# Patient Record
Sex: Male | Born: 1980 | Hispanic: Yes | Marital: Married | Smoking: Former smoker
Health system: Southern US, Community
[De-identification: ages and names within clinical notes are randomized; demographics above are authoritative.]

---

## 2010-04-08 ENCOUNTER — Emergency Department: Payer: Self-pay | Admitting: Emergency Medicine

## 2012-01-31 IMAGING — CR DG LUMBAR SPINE 2-3V
1 series · 3 of 3 positions shown · non-contrast
Comparison: none

REASON FOR EXAM: fell off roof, tender t11-l1
COMMENTS:

[Series 1: view not recorded · 0.17mm/px · 3 of 3 slices shown]
[im 1/3]
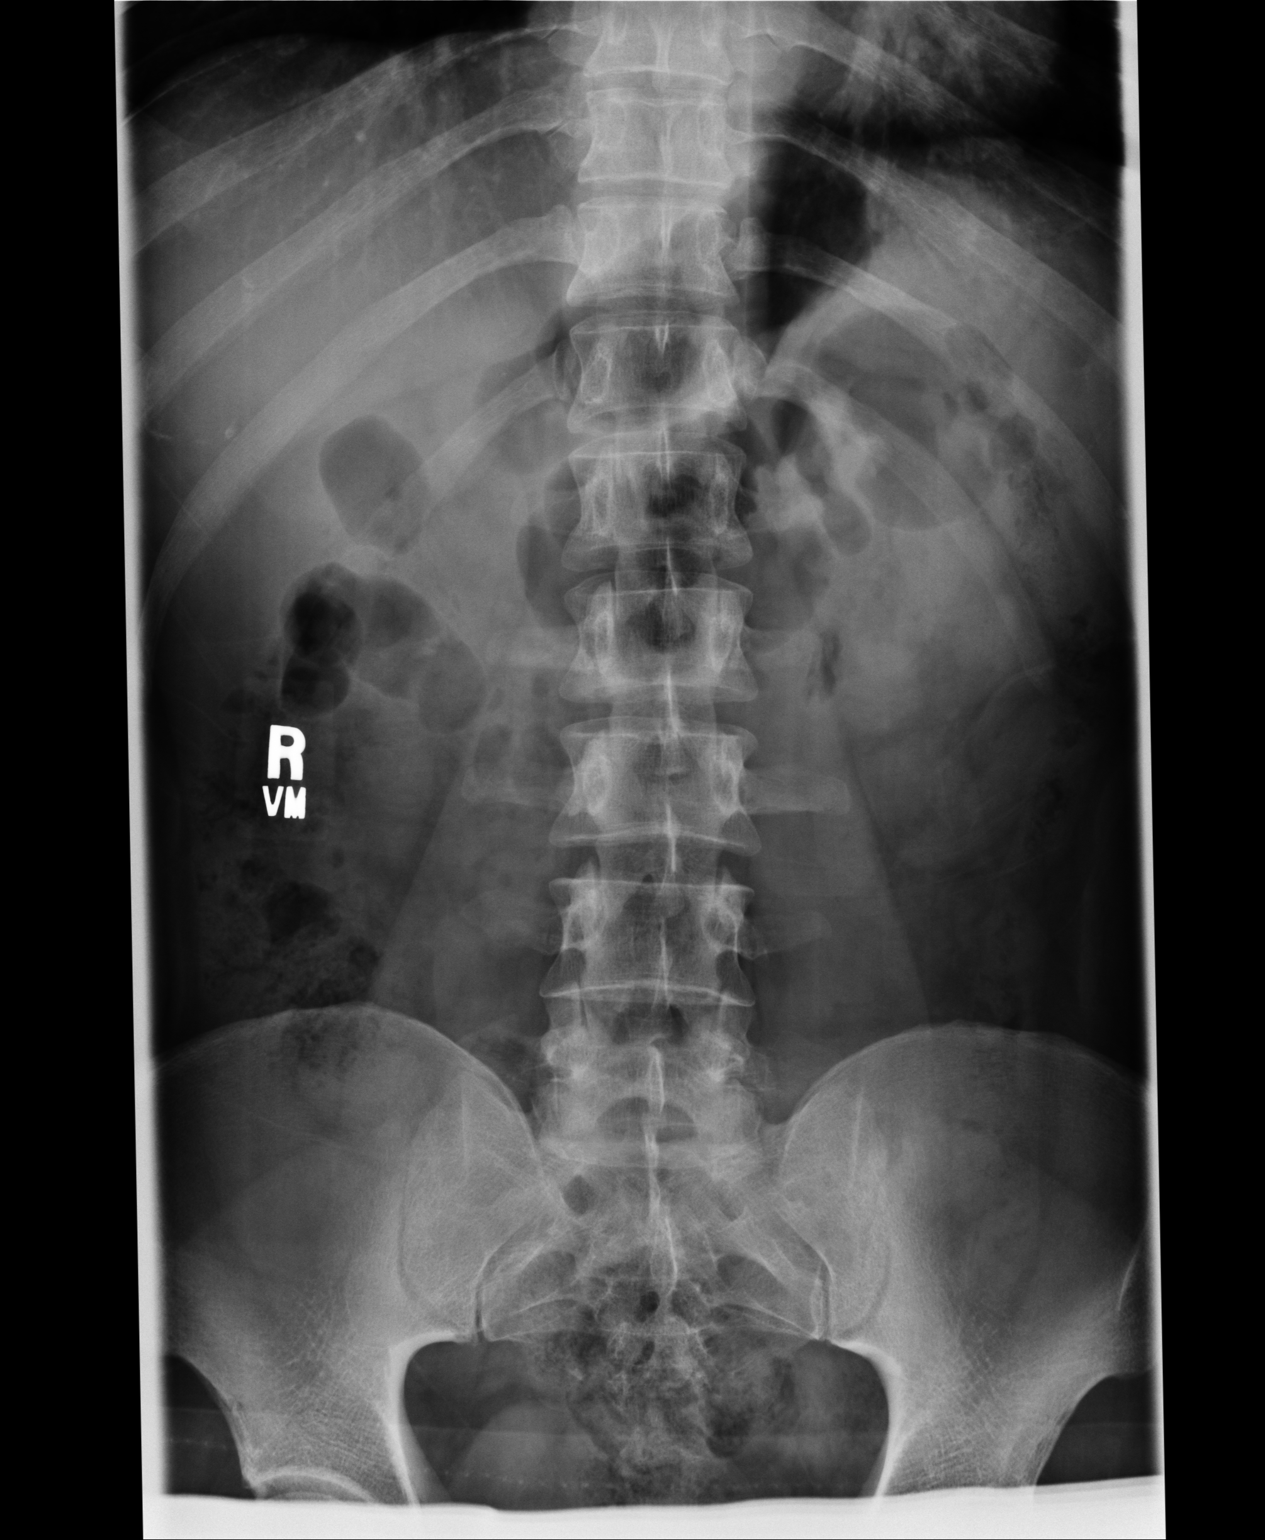
[im 2/3]
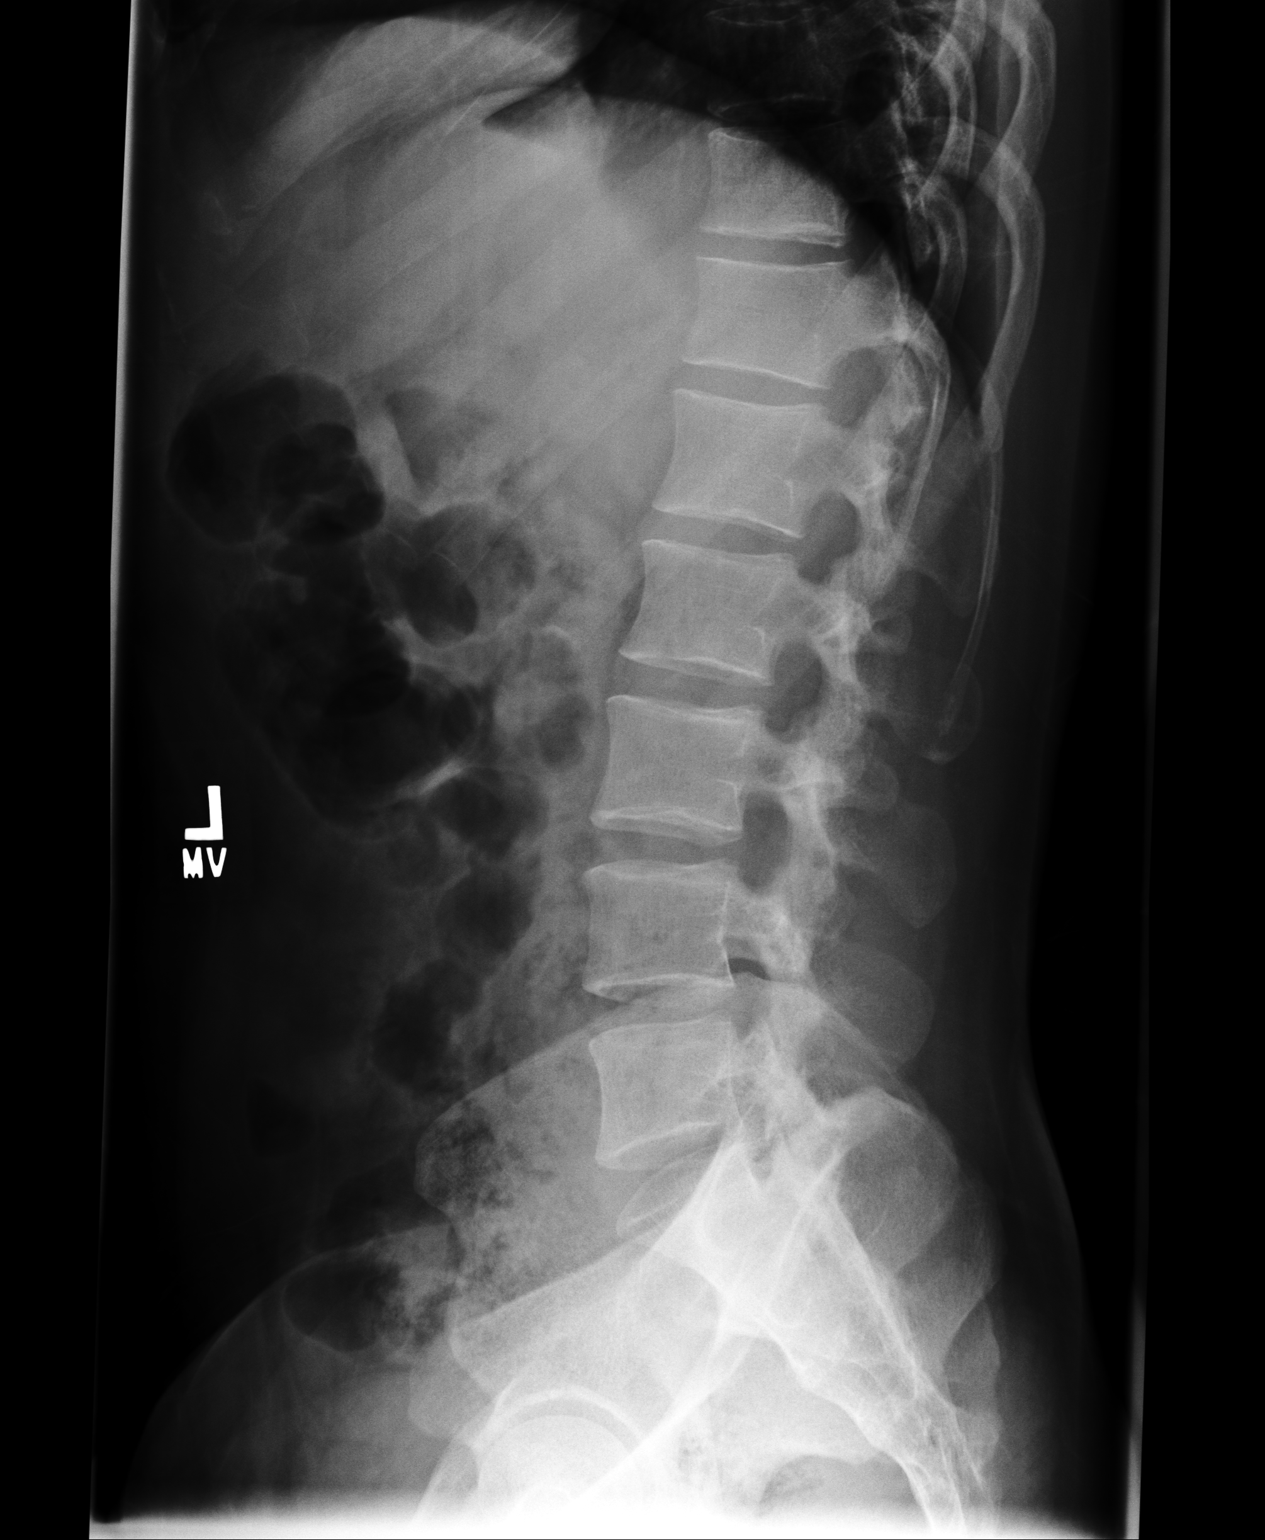
[im 3/3]
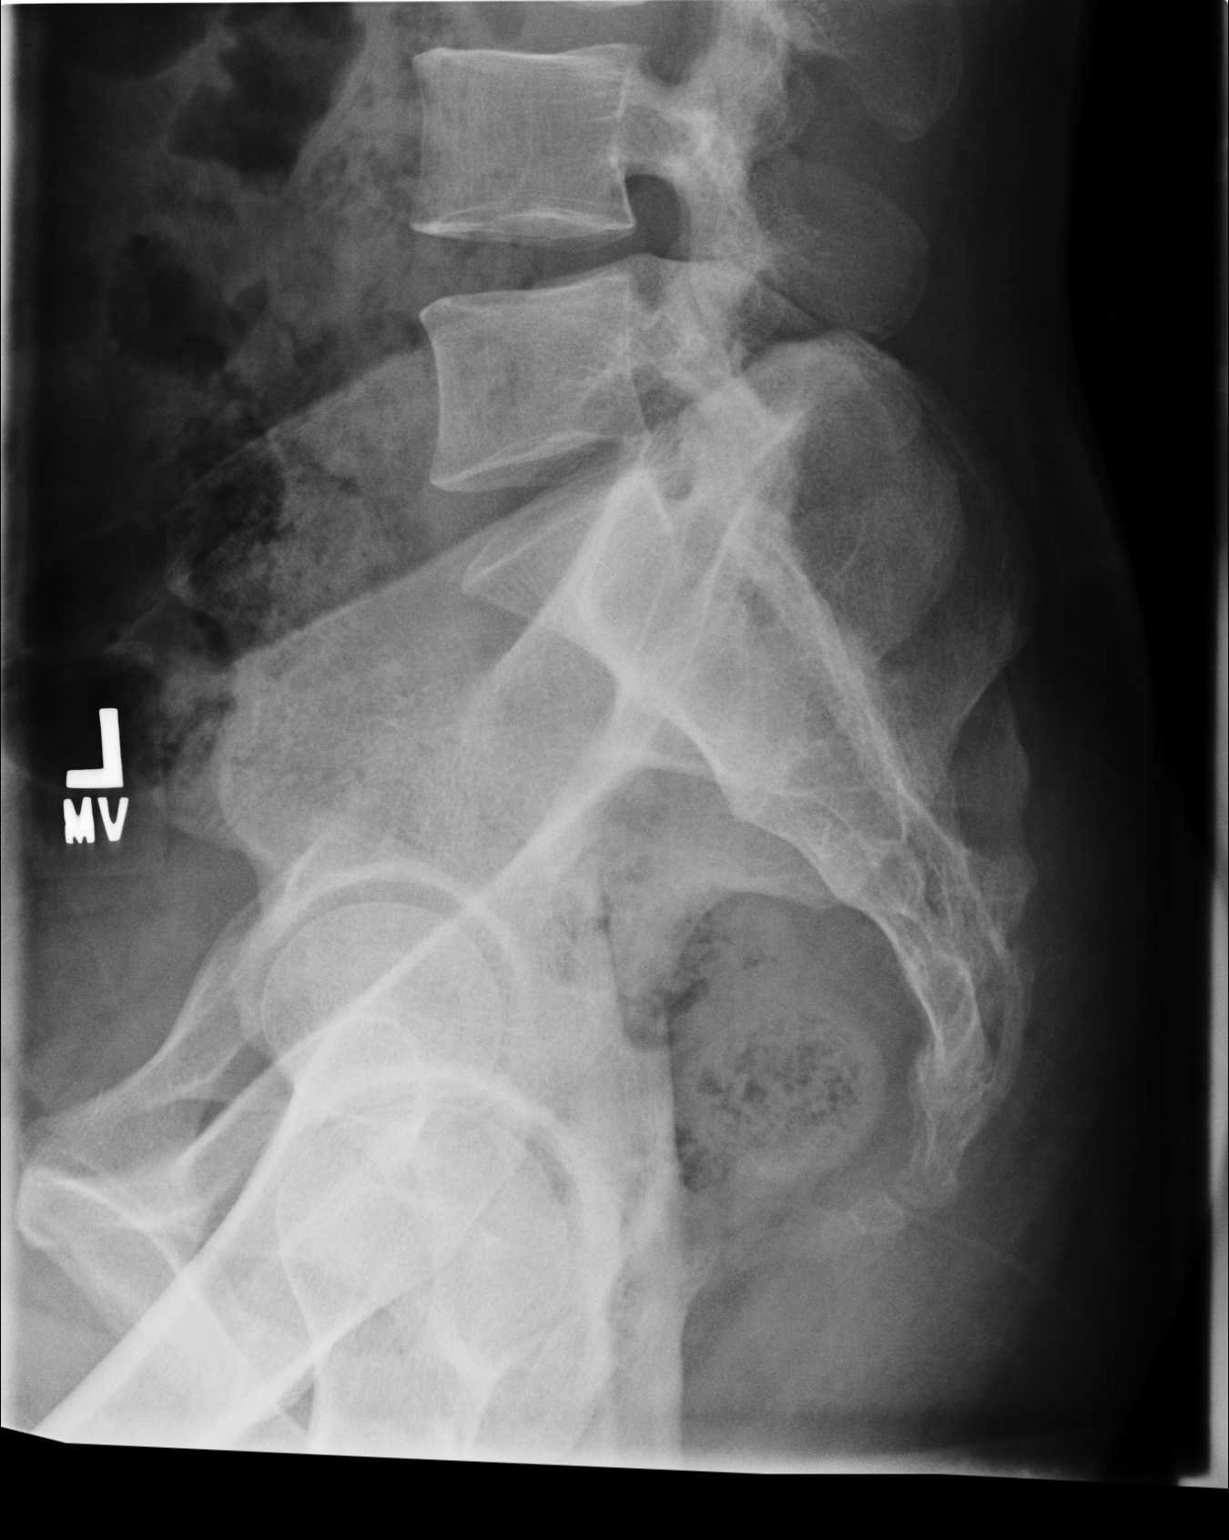

[3 of 3 positions shown; findings below may reference images not displayed]

PROCEDURE:     DXR - DXR LUMBAR SPINE AP AND LATERAL  - April 08, 2010 [DATE]

RESULT:     AP and lateral views of the lumbar spine were obtained. The
vertebral body heights and the intervertebral disc spaces are well
maintained. The vertebral body alignment is normal. The pedicles are
bilaterally intact.
IMPRESSION: 1.     No significant osseous abnormalities are noted.

## 2015-10-01 ENCOUNTER — Encounter: Payer: Self-pay | Admitting: Emergency Medicine

## 2015-10-01 ENCOUNTER — Emergency Department
Admission: EM | Admit: 2015-10-01 | Discharge: 2015-10-02 | Disposition: A | Discharge status: Home / Self Care | Payer: Self-pay | Attending: Emergency Medicine | Admitting: Emergency Medicine

## 2015-10-01 DIAGNOSIS — T6291XA Toxic effect of unspecified noxious substance eaten as food, accidental (unintentional), initial encounter: Secondary | ICD-10-CM | POA: Insufficient documentation

## 2015-10-01 DIAGNOSIS — D751 Secondary polycythemia: Secondary | ICD-10-CM | POA: Insufficient documentation

## 2015-10-01 DIAGNOSIS — Z87891 Personal history of nicotine dependence: Secondary | ICD-10-CM | POA: Insufficient documentation

## 2015-10-01 DIAGNOSIS — A059 Bacterial foodborne intoxication, unspecified: Secondary | ICD-10-CM

## 2015-10-01 LAB — CBC
HEMATOCRIT: 56.7 % — AB (ref 40.0–52.0)
Hemoglobin: 19.3 g/dL — ABNORMAL HIGH (ref 13.0–18.0)
MCH: 30.8 pg (ref 26.0–34.0)
MCHC: 34.1 g/dL (ref 32.0–36.0)
MCV: 90.4 fL (ref 80.0–100.0)
PLATELETS: 201 10*3/uL (ref 150–440)
RBC: 6.27 MIL/uL — AB (ref 4.40–5.90)
RDW: 13.5 % (ref 11.5–14.5)
WBC: 11.6 10*3/uL — AB (ref 3.8–10.6)

## 2015-10-01 LAB — COMPREHENSIVE METABOLIC PANEL
ALT: 44 U/L (ref 17–63)
AST: 36 U/L (ref 15–41)
Albumin: 6 g/dL — ABNORMAL HIGH (ref 3.5–5.0)
Alkaline Phosphatase: 78 U/L (ref 38–126)
Anion gap: 15 (ref 5–15)
BUN: 20 mg/dL (ref 6–20)
CHLORIDE: 102 mmol/L (ref 101–111)
CO2: 22 mmol/L (ref 22–32)
CREATININE: 1.14 mg/dL (ref 0.61–1.24)
Calcium: 10.3 mg/dL (ref 8.9–10.3)
GFR calc Af Amer: >60 mL/min (ref >60)
GFR calc non Af Amer: >60 mL/min (ref >60)
Glucose, Bld: 118 mg/dL — ABNORMAL HIGH (ref 65–99)
POTASSIUM: 3.9 mmol/L (ref 3.5–5.1)
SODIUM: 139 mmol/L (ref 135–145)
Total Bilirubin: 0.8 mg/dL (ref 0.3–1.2)
Total Protein: 9.4 g/dL — ABNORMAL HIGH (ref 6.5–8.1)

## 2015-10-01 LAB — LIPASE, BLOOD: LIPASE: 24 U/L (ref 11–51)

## 2015-10-01 MED ORDER — SODIUM CHLORIDE 0.9 % IV BOLUS (SEPSIS)
1000.0000 mL | Freq: Once | INTRAVENOUS | Status: AC
  Administered 2015-10-01: 1000 mL via INTRAVENOUS

## 2015-10-01 MED ORDER — ONDANSETRON HCL 4 MG PO TABS: 4.0000 mg | ORAL_TABLET | Freq: Three times a day (TID) | ORAL | Status: DC | PRN

## 2015-10-01 MED ORDER — ACETAMINOPHEN 325 MG PO TABS
650.0000 mg | ORAL_TABLET | Freq: Once | ORAL | Status: AC
  Administered 2015-10-02: 650 mg via ORAL
  Filled 2015-10-01: qty 2

## 2015-10-01 MED ORDER — ONDANSETRON HCL 4 MG/2ML IJ SOLN
4.0000 mg | Freq: Once | INTRAMUSCULAR | Status: AC | PRN
  Administered 2015-10-01: 4 mg via INTRAVENOUS
  Filled 2015-10-01: qty 2

## 2015-10-01 NOTE — ED Notes (Signed)
Used Spanish Tele-interpreter: Malissa Hippo(Vil [id number]).   Patient reports abdominal pain, vomiting and diarrhea after eating at noon.  Patient reports eating chicken for lunch, patient states the person who also ate the same chicken for lunch is also sick.  Patient is complaining of diffuse stomach pain.  Patient denies similar symptoms in the past.  Patient reports constant diarrhea and vomiting since eating.  Patient states diarrhea appears green.

## 2015-10-01 NOTE — Discharge Instructions (Signed)
Please call Dr. Sharlette DenseBrahmanday's office to set up an appointment. Please seek medical attention for any high fevers, chest pain, shortness of breath, change in behavior, persistent vomiting, bloody stool or any other new or concerning symptoms.   Intoxicacin por alimentos (Food Poisoning)  La intoxicacin alimentaria es una enfermedad causada por algo que ha comido o bebido. Puede durar entre 1 y [address] das. Los Musicianproblemas pueden empeorar en las personas que tienen el sistema inmunolgico debilitado, Teachers Insurance and Annuity Associationen los ancianos, los nios y los bebs, y en las mujeres Frankfortembarazadas.  CUIDADOS EN EL HOGAR  Beba gran cantidad de lquido para mantener la orina de tono claro o color amarillo plido. Beba pequeas cantidades de lquidos con frecuencia.  Consulte a su mdico como reponer la prdida de lquidos (rehidratacin).  Evite:  Los alimentos que Nurse, adulttengan mucha azcar.  El alcohol  Las bebidas gaseosas (carbonatadas).  El tabaco  Jugos  Bebidas con cafena  Lquidos muy calientes o fros  Alimentos muy grasos  Comer mucha cantidad por vez.  Productos lcteos hasta 24 a 48 horas despus que la materia fecal ya no sea lquida (diarrea).  Puede consumir alimentos que tengan cultivos activos (probiticos). Estos cultivos puede encontrarlos en algunos tipos de yogur y suplementos.  Lave bien sus manos para evitar el contagio de la enfermedad.  Tome la medicacin slo como le haya indicado el mdico. No administre aspirina a los nios.  Consulte al mdico si puede seguir Affiliated Computer Servicestomando los medicamentos que [place] habitualmente. SOLICITE AYUDA DE INMEDIATO SI:   Tiene problemas para respirar, tragar, hablar o moverse.  Tiene la visin borrosa.  No puede retener los lquidos.  Se siente dbil, sufre mareos o se desmaya.  Los ojos Bank of New York Companyestn amarillos.  Contina vomitando o con diarrea.  Comienza a Psychiatristsentir dolor en el vientre (abdominal), el dolor empeora o se sita en un pequeo punto ( se  localiza).  Tiene fiebre.  Observa sangre con la diarrea.  Se siente muy dbil, mareado o tiene mucha sed.  No ha orinado por 8 horas. ASEGRESE QUE:   Comprende estas instrucciones.  Controlar su enfermedad.  Solicitar ayuda de inmediato si no mejora o empeora.   Esta informacin no tiene Theme park managercomo fin reemplazar el consejo del mdico. Asegrese de hacerle al mdico cualquier pregunta que tenga.   Document Released: 06/18/2010 Document Revised: 08/08/2011 Elsevier Interactive Patient Education [employer]2016 Elsevier Inc.

## 2015-10-01 NOTE — ED Notes (Signed)
Pt informed urine sample is needed, and provided a urinal

## 2015-10-01 NOTE — ED Provider Notes (Signed)
[facility] Emergency Department Provider Note  ____________________________________________  Time seen: ~2130  I have reviewed the triage vital signs and the nursing notes.   HISTORY  Chief Complaint Abdominal pain, vomiting  History limited by: Language [facility] Interpreter utilized   HPI Cesar Pace is a 35 y.o. male who presents to the emergency department today with concerns for abdominal pain, nausea and vomiting. The patient states that the pain started roughly 2 hours after he ate chicken. It started suddenly. He describes his pain as being diffuse. It was severe. He states that someone else who also ate the chicken was also sick although had a milder case. Did have multiple episodes of nonbloody vomiting. Denies any fevers. Denies similar symptoms in the past.   History reviewed. No pertinent past medical history.  There are no active problems to display for this patient.   History reviewed. No pertinent past surgical history.  No current outpatient prescriptions on file.  Allergies Review of patient's allergies indicates no known allergies.  No family history on file.  Social History Social History  Substance Use Topics  . Smoking status: Never Smoker   . Smokeless tobacco: None  . Alcohol Use: No    Review of Systems  Constitutional: Negative for fever. Cardiovascular: Negative for chest pain. Respiratory: Negative for shortness of breath. Gastrointestinal: Positive for abdominal pain, nausea and vomiting Neurological: Negative for headaches, focal weakness or numbness.  10-point ROS otherwise negative.  ____________________________________________   PHYSICAL EXAM:  VITAL SIGNS: ED Triage Vitals  Enc Vitals Group     BP 10/01/15 1900 110/69 mmHg     Pulse Rate 10/01/15 1900 87     Resp 10/01/15 1900 18     Temp 10/01/15 1900 97.8 F (36.6 C)     Temp Source 10/01/15 1900 Oral     SpO2 10/01/15  1900 100 %     Weight 10/01/15 1900 154 lb 5.2 oz (70 kg)     Height 10/01/15 1900  (1.626 m)     Head Cir --      Peak Flow --      Pain Score 10/01/15 1901 10   Constitutional: Alert and oriented. Well appearing and in no distress. Eyes: Conjunctivae are normal. PERRL. Normal extraocular movements. ENT   Head: Normocephalic and atraumatic.   Nose: No congestion/rhinnorhea.   Mouth/Throat: Mucous membranes are moist.   Neck: No stridor. Hematological/Lymphatic/Immunilogical: No cervical lymphadenopathy. Cardiovascular: Tachycardic, regular rhythm.  No murmurs, rubs, or gallops. Respiratory: Normal respiratory effort without tachypnea nor retractions. Breath sounds are clear and equal bilaterally. No wheezes/rales/rhonchi. Gastrointestinal: Soft and nontender. No distention.  Genitourinary: Deferred Musculoskeletal: Normal range of motion in all extremities. No joint effusions.  No lower extremity tenderness nor edema. Neurologic:  Normal speech and language. No gross focal neurologic deficits are appreciated.  Skin:  Skin is warm, dry and intact. No rash noted. Psychiatric: Mood and affect are normal. Speech and behavior are normal. Patient exhibits appropriate insight and judgment.  ____________________________________________    LABS (pertinent positives/negatives)  Labs Reviewed  COMPREHENSIVE METABOLIC PANEL - Abnormal; Notable for the following:    Glucose, Bld 118 (*)    Total Protein 9.4 (*)    Albumin 6.0 (*)    All other components within normal limits  CBC - Abnormal; Notable for the following:    WBC 11.6 (*)    RBC 6.27 (*)    Hemoglobin 19.3 (*)    HCT 56.7 (*)  All other components within normal limits  LIPASE, BLOOD  URINALYSIS COMPLETEWITH MICROSCOPIC ([facility] ONLY)     ____________________________________________   EKG  None  ____________________________________________     RADIOLOGY  None  ____________________________________________   PROCEDURES  Procedure(s) performed: None  Critical Care performed: No  ____________________________________________   INITIAL IMPRESSION / ASSESSMENT AND PLAN / ED COURSE  Pertinent labs & imaging results that were available during my care of the patient were reviewed by me and considered in my medical decision making (see chart for details).  Patient presents to the emergency department with abdominal pain, nausea and vomiting. I think this is likely related to food poisoning given that his companion also got sick. On my exam abdomen is nontender. The patient's heart rate did improve after fluids. Patient did not have any further vomiting after Zofran. On blood work however he does show quite elevated red blood cells and hemoglobin. He has no known history of such. Will give him hematology follow-up.  ____________________________________________   FINAL CLINICAL IMPRESSION(S) / ED DIAGNOSES  Final diagnoses:  Food poisoning     Phineas SemenGraydon Shetara Launer, MD 10/01/15 2322

## 2015-10-01 NOTE — ED Notes (Signed)
Informed MD Derrill KayGoodman of pt's temperature

## 2015-10-01 NOTE — ED Notes (Signed)
MD Derrill KayGoodman at bedside with translator.

## 2015-10-01 NOTE — ED Notes (Signed)
Used spanish interpreter to assess patient

## 2015-10-01 NOTE — ED Notes (Signed)
PT reports he has tried to taken peptobismal twice today, and vomited both times. He has been unable to tolerate any food or fluids. Pt denies current nausea.

## 2015-10-01 NOTE — ED Notes (Signed)
Pt reports he at lunch at approx 12pm (chicken), and 2 hours later began to have N/V/D, chills and abdominal pain. Pt reports he has been unable to tolerate any food/fluids since onset of symptoms. Pt reports his lunch mate also currently having the same symptoms.

## 2015-10-01 NOTE — ED Notes (Signed)
Developed generalized stomach about mid day today with n/v

## 2015-10-02 LAB — URINALYSIS COMPLETE WITH MICROSCOPIC (ARMC ONLY)
BILIRUBIN URINE: NEGATIVE
Glucose, UA: NEGATIVE mg/dL
HGB URINE DIPSTICK: NEGATIVE
Leukocytes, UA: NEGATIVE
Nitrite: NEGATIVE
PH: 5 (ref 5.0–8.0)
PROTEIN: 30 mg/dL — AB
Specific Gravity, Urine: 1.027 (ref 1.005–1.030)

## 2015-10-02 NOTE — ED Notes (Signed)
  Reviewed d/c instructions, follow-up care, and prescriptions with pt. Pt verbalized understanding 

## 2015-10-05 ENCOUNTER — Inpatient Hospital Stay: Payer: Self-pay

## 2015-10-05 ENCOUNTER — Encounter: Payer: Self-pay | Admitting: Hematology and Oncology

## 2015-10-05 ENCOUNTER — Inpatient Hospital Stay: Payer: Self-pay | Attending: Hematology and Oncology | Admitting: Hematology and Oncology

## 2015-10-05 VITALS — BP 111/68 | HR 68 | Temp 97.7°F | Resp 18 | Ht 64.0 in | Wt 165.1 lb

## 2015-10-05 DIAGNOSIS — D751 Secondary polycythemia: Secondary | ICD-10-CM | POA: Insufficient documentation

## 2015-10-05 DIAGNOSIS — R718 Other abnormality of red blood cells: Secondary | ICD-10-CM | POA: Insufficient documentation

## 2015-10-05 DIAGNOSIS — Z87891 Personal history of nicotine dependence: Secondary | ICD-10-CM | POA: Insufficient documentation

## 2015-10-05 DIAGNOSIS — R779 Abnormality of plasma protein, unspecified: Secondary | ICD-10-CM

## 2015-10-05 DIAGNOSIS — E8809 Other disorders of plasma-protein metabolism, not elsewhere classified: Secondary | ICD-10-CM

## 2015-10-05 LAB — CBC WITH DIFFERENTIAL/PLATELET
Basophils Absolute: 0 10*3/uL (ref 0–0.1)
Basophils Relative: 0 %
Eosinophils Absolute: 0.1 10*3/uL (ref 0–0.7)
Eosinophils Relative: 2 %
HCT: 49.4 % (ref 40.0–52.0)
Hemoglobin: 17.4 g/dL (ref 13.0–18.0)
Lymphocytes Relative: 39 %
Lymphs Abs: 2 10*3/uL (ref 1.0–3.6)
MCH: 31.2 pg (ref 26.0–34.0)
MCHC: 35.1 g/dL (ref 32.0–36.0)
MCV: 88.8 fL (ref 80.0–100.0)
Monocytes Absolute: 0.4 10*3/uL (ref 0.2–1.0)
Monocytes Relative: 8 %
Neutro Abs: 2.7 10*3/uL (ref 1.4–6.5)
Neutrophils Relative %: 51 %
Platelets: 203 10*3/uL (ref 150–440)
RBC: 5.56 MIL/uL (ref 4.40–5.90)
RDW: 13.2 % (ref 11.5–14.5)
WBC: 5.2 10*3/uL (ref 3.8–10.6)

## 2015-10-05 LAB — FERRITIN: Ferritin: 127 ng/mL (ref 24–336)

## 2015-10-05 LAB — IRON AND TIBC
Iron: 68 ug/dL (ref 45–182)
Saturation Ratios: 23 % (ref 17.9–39.5)
TIBC: 296 ug/dL (ref 250–450)
UIBC: 228 ug/dL

## 2015-10-05 NOTE — Progress Notes (Signed)
Since ER visit pt reports these days he has felt tired

## 2015-10-05 NOTE — Progress Notes (Signed)
[name]:  10/05/2015  Chief Complaint: Cesar Pace is a 35 y.o. 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 Nance Pear for assessment and management.  HPI:  The patient denies any past medical history.  He does not have a primary care physician.  He gets his health care through the emergency room.  Prior to recent events, he has never had his blood drawn.  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  He describes eating chicken with broth cooked on the job site in a microwave.  Approximately 1 hour after eating, he began to vomit.  He presented to the ER on 10/01/2015 with abdominal pain, nausea, and vomiting.  Another co-worker became ill, but had a "milder case".  He states that he was drinking fluids well on the job site.  He denied dehydration.  ER evaluation revealed a hematocrit of 56.7, hemoglobin 19.3, MCV 90.4, platelets 201,000, and WBC 11,600.  BUN was 20 with a creatinine of 1.14.  Calcium was 10.3.  Albumen was 6.0.  Protein was 9.4.  Urinalysis revealed a specific gravity of 1.027.  He received IV fluids.  The patient denies any tobacco use.  He denies any cardiac or pulmonary problems.  He denies sleep apnea.  He denies any medications or herbal products.  He does not take testosterone.  He denies any family history of any blood disorder.  Symptomatically, he feels good.  He denies any complaints.  He describes 2 episodes of nausea approximately 6 months ago.  He denies any headache or visual changes.  He denies any numbness or tingling.  He denies any fevers, sweats or weight loss.  No past medical history on file.  No past surgical history on file.  Family History  Problem Relation Age of Onset  . Hypertension Mother     Social History:  reports that he quit smoking about 10 years ago. His smoking use included Cigarettes. He smoked 0.50 packs per day. He has never used smokeless tobacco. He reports that he does  not drink alcohol or use illicit drugs.  The patient is from [place].  He has been in the [place] states for 12 years.  He lived in [place] for 3 years.  He has lived in [place] for 9 years.  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  Allergies: No Known Allergies  Current Medications: Current Outpatient Prescriptions  Medication Sig Dispense Refill  . ondansetron (ZOFRAN) 4 MG tablet Take 1 tablet (4 mg total) by mouth every 8 (eight) hours as needed for nausea or vomiting. 20 tablet 0   No current facility-administered medications for this visit.    Review of Systems:  GENERAL:  Feels good.  Active.  No fevers, sweats or weight loss. PERFORMANCE STATUS (ECOG):  0 HEENT:  No visual changes, runny nose, sore throat, mouth sores or tenderness. Lungs: No shortness of breath or cough.  No hemoptysis. Cardiac:  No chest pain, palpitations, orthopnea, or PND. GI:  No nausea, vomiting, diarrhea, constipation, melena or hematochezia. GU:  No urgency, frequency, dysuria, or hematuria. Musculoskeletal:  No back pain.  No joint pain.  No muscle tenderness. Extremities:  No pain or swelling. Skin:  No rashes or skin changes. Neuro:  No headache, numbness or weakness, balance or coordination issues. Endocrine:  No diabetes, thyroid issues, hot flashes or night sweats. Psych:  No mood changes, depression or anxiety. Pain:  No focal pain. Review of  systems:  All other systems reviewed and found to be negative.  Physical Exam: Blood pressure 111/68, pulse 68, temperature 97.7 F (36.5 C), temperature source Tympanic, resp. rate 18, height 5' 4"  (1.626 m), weight 165 lb 2 oz (74.9 kg). GENERAL:  Well developed, well nourished, gentleman sitting comfortably in the exam room in no acute distress. MENTAL STATUS:  Alert and oriented to person, place and time. HEAD:  Short black hair.  Mustache.  Normocephalic, atraumatic, face symmetric, no Cushingoid features. EYES:   Brown eyes.  Pupils equal round and reactive to light and accomodation.  No conjunctivitis or scleral icterus. ENT:  Oropharynx clear without lesion.  Tongue normal. Mucous membranes moist.  RESPIRATORY:  Clear to auscultation without rales, wheezes or rhonchi. CARDIOVASCULAR:  Regular rate and rhythm without murmur, rub or gallop. ABDOMEN:  Soft, non-tender, with active bowel sounds, and no hepatosplenomegaly.  No masses. SKIN:  No rashes, ulcers or lesions. EXTREMITIES: No edema, no skin discoloration or tenderness.  No palpable cords. LYMPH NODES: No palpable cervical, supraclavicular, axillary or inguinal adenopathy  NEUROLOGICAL: Unremarkable. PSYCH:  Appropriate.  No visits with results within 3 Day(s) from this visit. Latest known visit with results is:  Admission on 10/01/2015, Discharged on 10/02/2015  Component Date Value Ref Range Status  . Lipase 10/01/2015 24  11 - 51 U/L Final  . Sodium 10/01/2015 139  135 - 145 mmol/L Final  . Potassium 10/01/2015 3.9  3.5 - 5.1 mmol/L Final  . Chloride 10/01/2015 102  101 - 111 mmol/L Final  . CO2 10/01/2015 22  22 - 32 mmol/L Final  . Glucose, Bld 10/01/2015 118* 65 - 99 mg/dL Final  . BUN 10/01/2015 20  6 - 20 mg/dL Final  . Creatinine, Ser 10/01/2015 1.14  0.61 - 1.24 mg/dL Final  . Calcium 10/01/2015 10.3  8.9 - 10.3 mg/dL Final  . Total Protein 10/01/2015 9.4* 6.5 - 8.1 g/dL Final  . Albumin 10/01/2015 6.0* 3.5 - 5.0 g/dL Final  . AST 10/01/2015 36  15 - 41 U/L Final  . ALT 10/01/2015 44  17 - 63 U/L Final  . Alkaline Phosphatase 10/01/2015 78  38 - 126 U/L Final  . Total Bilirubin 10/01/2015 0.8  0.3 - 1.2 mg/dL Final  . GFR calc non Af Amer 10/01/2015 >60  >60 mL/min Final  . GFR calc Af Amer 10/01/2015 >60  >60 mL/min Final   Comment: (NOTE) The eGFR has been calculated using the CKD EPI equation. This calculation has not been validated in all clinical situations. eGFR's persistently <60 mL/min signify possible Chronic  Kidney Disease.   . Anion gap 10/01/2015 15  5 - 15 Final  . WBC 10/01/2015 11.6* 3.8 - 10.6 K/uL Final  . RBC 10/01/2015 6.27* 4.40 - 5.90 MIL/uL Final  . Hemoglobin 10/01/2015 19.3* 13.0 - 18.0 g/dL Final  . HCT 10/01/2015 56.7* 40.0 - 52.0 % Final  . MCV 10/01/2015 90.4  80.0 - 100.0 fL Final  . MCH 10/01/2015 30.8  26.0 - 34.0 pg Final  . MCHC 10/01/2015 34.1  32.0 - 36.0 g/dL Final  . RDW 10/01/2015 13.5  11.5 - 14.5 % Final  . Platelets 10/01/2015 201  150 - 440 K/uL Final  . Color, Urine 10/02/2015 YELLOW* YELLOW Final  . APPearance 10/02/2015 CLEAR* CLEAR Final  . Glucose, UA 10/02/2015 NEGATIVE  NEGATIVE mg/dL Final  . Bilirubin Urine 10/02/2015 NEGATIVE  NEGATIVE Final  . Ketones, ur 10/02/2015 TRACE* NEGATIVE mg/dL Final  . Specific Gravity,  Urine 10/02/2015 1.027  1.005 - 1.030 Final  . Hgb urine dipstick 10/02/2015 NEGATIVE  NEGATIVE Final  . pH 10/02/2015 5.0  5.0 - 8.0 Final  . Protein, ur 10/02/2015 30* NEGATIVE mg/dL Final  . Nitrite 10/02/2015 NEGATIVE  NEGATIVE Final  . Leukocytes, UA 10/02/2015 NEGATIVE  NEGATIVE Final  . RBC / HPF 10/02/2015 0-5  0 - 5 RBC/hpf Final  . WBC, UA 10/02/2015 0-5  0 - 5 WBC/hpf Final  . Bacteria, UA 10/02/2015 RARE* NONE SEEN Final  . Squamous Epithelial / LPF 10/02/2015 0-5* NONE SEEN Final  . Mucous 10/02/2015 PRESENT   Final  . Hyaline Casts, UA 10/02/2015 PRESENT   Final    Assessment:  Cesar Pace is a 35 y.o. male with no past medical history presented with erythrocytosis on 10/01/2015 after working outside and experiencing several episodes of nausea and vomiting.  Labs suggest possible dehydration.  He has no prior labs.  He denies any tobacco use.  He denies any cardiac or pulmonary problems.  He denies sleep apnea.  He does not take testosterone.  He denies any family history of any blood disorder.  Labs on 10/01/2015 revealed a hematocrit of 56.7, hemoglobin 19.3, MCV 90.4, platelets 201,000, and WBC 11,600.   BUN was 20 with a creatinine of 1.14.  Calcium was 10.3, albumen 6.0, and protein 9.4.    Symptomatically, he feels well.  Exam is unremarkable.  Plan: 1.  Review diagnosis of erythrocytosis.  Discuss secondary versus primary polycythemia.  Discuss laboratory work-up.  Discuss potential phlebotomy today. 2.  Labs today:  CBC with diff, ferritin, iron studies, carboxyhemoglobin level, epo level, JAK2 with reflex to exon 12, SPEP with immunoglobulins. 3.  RTC on 10/16/2015 for review of work-up and discussion regarding direction of therapy.    Lequita Asal, MD  10/05/2015, 2:58 PM

## 2015-10-06 LAB — PROTEIN ELECTROPHORESIS, SERUM
A/G Ratio: 1.4 (ref 0.7–1.7)
Albumin ELP: 4.2 g/dL (ref 2.9–4.4)
Alpha-1-Globulin: 0.1 g/dL (ref 0.0–0.4)
Alpha-2-Globulin: 0.7 g/dL (ref 0.4–1.0)
Beta Globulin: 1 g/dL (ref 0.7–1.3)
Gamma Globulin: 1.1 g/dL (ref 0.4–1.8)
Globulin, Total: 3 g/dL (ref 2.2–3.9)
Total Protein ELP: 7.2 g/dL (ref 6.0–8.5)

## 2015-10-06 LAB — CARBON MONOXIDE, BLOOD (PERFORMED AT REF LAB): Carbon Monoxide, Blood: 2.3 % (ref 0.0–3.6)

## 2015-10-16 ENCOUNTER — Inpatient Hospital Stay (HOSPITAL_BASED_OUTPATIENT_CLINIC_OR_DEPARTMENT_OTHER): Payer: Self-pay | Admitting: Hematology and Oncology

## 2015-10-16 ENCOUNTER — Inpatient Hospital Stay: Payer: Self-pay

## 2015-10-16 VITALS — BP 116/70 | HR 78 | Temp 97.5°F | Resp 18 | Wt 163.9 lb

## 2015-10-16 DIAGNOSIS — R718 Other abnormality of red blood cells: Secondary | ICD-10-CM

## 2015-10-16 DIAGNOSIS — D751 Secondary polycythemia: Secondary | ICD-10-CM

## 2015-10-16 DIAGNOSIS — Z87891 Personal history of nicotine dependence: Secondary | ICD-10-CM

## 2015-10-16 LAB — CBC WITH DIFFERENTIAL/PLATELET
Basophils Absolute: 0.1 10*3/uL (ref 0–0.1)
Basophils Relative: 1 %
Eosinophils Absolute: 0.3 10*3/uL (ref 0–0.7)
Eosinophils Relative: 3 %
HCT: 45 % (ref 40.0–52.0)
Hemoglobin: 16 g/dL (ref 13.0–18.0)
Lymphocytes Relative: 18 %
Lymphs Abs: 1.7 10*3/uL (ref 1.0–3.6)
MCH: 31.5 pg (ref 26.0–34.0)
MCHC: 35.5 g/dL (ref 32.0–36.0)
MCV: 88.7 fL (ref 80.0–100.0)
Monocytes Absolute: 0.6 10*3/uL (ref 0.2–1.0)
Monocytes Relative: 6 %
Neutro Abs: 6.9 10*3/uL — ABNORMAL HIGH (ref 1.4–6.5)
Neutrophils Relative %: 72 %
Platelets: 161 10*3/uL (ref 150–440)
RBC: 5.08 MIL/uL (ref 4.40–5.90)
RDW: 13 % (ref 11.5–14.5)
WBC: 9.5 10*3/uL (ref 3.8–10.6)

## 2015-10-16 NOTE — Progress Notes (Signed)
Pt has a cold x 3 days, no other complaints at this time.

## 2015-10-17 ENCOUNTER — Encounter: Payer: Self-pay | Admitting: Hematology and Oncology

## 2015-10-17 NOTE — Progress Notes (Signed)
[facility]-  [facility] Center  Clinic day:  10/16/2015  Chief Complaint: Cesar Pace is a 35 y.o. 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  HPI:  The patient was last seen in the medical oncology clinic on 10/05/2015 for initial consultation.  He had presented to the emergency room with nausea and vomiting.  Labs revealed a hematocrit of 56.7 and hemoglobin 19.3.  Protein was 9.4.  He denied any tobacco use, cardiac or pulmonary problems, sleep apnea, testosterone use or any family history of a blood disorder.  Symptomatically, he felt good.  Exam was unremarkable.  Labs on 10/05/2015 revealed a  hematocrit of 49.4, hemoglobin 17.4, MCV 88.8, platelets 203,000, white count 5200 with an ANC of 2700. Differential was unremarkable.  Carbon monoxide level was 2.3 (normal). Ferritin was 127 with an iron saturation of 23% a TIBC of 296 (normal). Serum protein electrophoresis.  Revealed no monoclonal protein.  Pending are erythropoietin level, JAK2 and immunoglobulin levels.  During the interim, he has done well.  He denies any complaint.  He notes a mild URI.   No past medical history on file.  No past surgical history on file.  Family History  Problem Relation Age of Onset  . Hypertension Mother     Social History:  reports that he quit smoking about 10 years ago. His smoking use included Cigarettes. He smoked 0.50 packs per day. He has never used smokeless tobacco. He reports that he does not drink alcohol or use illicit drugs.  The patient is from [place].  He has been in the [place] states for 12 years.  He lived in [place] for 3 years.  He has lived in [place] for 9 years.  He works Holiday representative. The patient is accompanied by the interpreter today.  Allergies: No Known Allergies  Current Medications: Current Outpatient Prescriptions  Medication Sig Dispense Refill  . ondansetron (ZOFRAN) 4 MG  tablet Take 1 tablet (4 mg total) by mouth every 8 (eight) hours as needed for nausea or vomiting. (Patient not taking: Reported on 10/16/2015) 20 tablet 0   No current facility-administered medications for this visit.    Review of Systems:  GENERAL:  Feels good.  Active.  No fevers, sweats or weight loss. PERFORMANCE STATUS (ECOG):  0 HEENT:  URI x 3 days.  No visual changes, runny nose, sore throat, mouth sores or tenderness. Lungs: No shortness of breath or cough.  No hemoptysis. Cardiac:  No chest pain, palpitations, orthopnea, or PND. GI:  No nausea, vomiting, diarrhea, constipation, melena or hematochezia. GU:  No urgency, frequency, dysuria, or hematuria. Musculoskeletal:  No back pain.  No joint pain.  No muscle tenderness. Extremities:  No pain or swelling. Skin:  No rashes or skin changes. Neuro:  No headache, numbness or weakness, balance or coordination issues. Endocrine:  No diabetes, thyroid issues, hot flashes or night sweats. Psych:  No mood changes, depression or anxiety. Pain:  No focal pain. Review of systems:  All other systems reviewed and found to be negative.  Physical Exam: Blood pressure 116/70, pulse 78, temperature 97.5 F (36.4 C), temperature source Tympanic, resp. rate 18, weight 163 lb 14.6 oz (74.35 kg). GENERAL:  Well developed, well nourished, gentleman sitting comfortably in the exam room in no acute distress. MENTAL STATUS:  Alert and oriented to person, place and time. HEAD:  Short black hair.  Mustache.  Normocephalic, atraumatic, face symmetric, no Cushingoid features. EYES:  Brown eyes.  No conjunctivitis or scleral icterus. NEUROLOGICAL: Unremarkable. PSYCH:  Appropriate.  Appointment on 10/16/2015  Component Date Value Ref Range Status  . WBC 10/16/2015 9.5  3.8 - 10.6 K/uL Final  . RBC 10/16/2015 5.08  4.40 - 5.90 MIL/uL Final  . Hemoglobin 10/16/2015 16.0  13.0 - 18.0 g/dL Final  . HCT 62/95/284105/19/2017 45.0  40.0 - 52.0 % Final  . MCV  10/16/2015 88.7  80.0 - 100.0 fL Final  . MCH 10/16/2015 31.5  26.0 - 34.0 pg Final  . MCHC 10/16/2015 35.5  32.0 - 36.0 g/dL Final  . RDW 32/44/010205/19/2017 13.0  11.5 - 14.5 % Final  . Platelets 10/16/2015 161  150 - 440 K/uL Final  . Neutrophils Relative % 10/16/2015 72   Final  . Neutro Abs 10/16/2015 6.9* 1.4 - 6.5 K/uL Final  . Lymphocytes Relative 10/16/2015 18   Final  . Lymphs Abs 10/16/2015 1.7  1.0 - 3.6 K/uL Final  . Monocytes Relative 10/16/2015 6   Final  . Monocytes Absolute 10/16/2015 0.6  0.2 - 1.0 K/uL Final  . Eosinophils Relative 10/16/2015 3   Final  . Eosinophils Absolute 10/16/2015 0.3  0 - 0.7 K/uL Final  . Basophils Relative 10/16/2015 1   Final  . Basophils Absolute 10/16/2015 0.1  0 - 0.1 K/uL Final    Assessment:  Cesar Pace is a 35 y.o. male with no past medical history presented with erythrocytosis on 10/01/2015 after working outside and experiencing several episodes of nausea and vomiting.  Labs suggested possible dehydration.  He has no prior labs.  He denies any tobacco use.  He denies any cardiac or pulmonary problems.  He denies sleep apnea.  He does not take testosterone.  He denies any family history of any blood disorder.  Labs on 10/01/2015 revealed a hematocrit of 56.7, hemoglobin 19.3, MCV 90.4, platelets 201,000, and WBC 11,600.  BUN was 20 with a creatinine of 1.14.  Calcium was 10.3, albumen 6.0, and protein 9.4.    Work-up on 10/05/2015 revealed a  hematocrit of 49.4, hemoglobin 17.4, MCV 88.8, platelets 203,000, white count 5200 with an ANC of 2700. Differential was unremarkable.  Normal labs included the following: carbon monoxide level (2.3), ferritin (127), iron studies, and SPEP.  Symptomatically, he feels well.  Exam is unremarkable.  Plan: 1.  Review work-up.  Labs are normal.  There are a few pending labs.  Discuss concern that initial elevation in hematocrit was due to dehydration.  Discuss no indication for phlebotomy at the  present time.  Discuss maintaining good hydration especially on job site in the summer. 2.  Labs today:  CBC with diff. 3.  No indication for phlebotomy. 4.  Follow-up pending labs:  erythropoietin level, JAK2 and immunoglobulin levels. 5.  RTC in 1 month for MD assess and labs (CBC with diff, CMP).    Rosey BathMelissa C Corcoran, MD  10/16/2015

## 2015-10-19 LAB — IGG, IGA, IGM
IgA: 229 mg/dL (ref 90–386)
IgG (Immunoglobin G), Serum: 1141 mg/dL (ref 700–1600)
IgM, Serum: 112 mg/dL (ref 20–172)

## 2015-10-19 LAB — JAK2  V617F QUAL. WITH REFLEX TO EXON 12

## 2015-10-19 LAB — JAK2 EXON 12 MUTATION ANALYSIS

## 2015-10-19 LAB — ERYTHROPOIETIN: Erythropoietin: 6.2 m[IU]/mL (ref 2.6–18.5)

## 2015-11-16 ENCOUNTER — Encounter: Payer: Self-pay | Admitting: Hematology and Oncology

## 2015-11-16 ENCOUNTER — Inpatient Hospital Stay: Payer: Self-pay

## 2015-11-16 ENCOUNTER — Inpatient Hospital Stay: Payer: Self-pay | Attending: Hematology and Oncology | Admitting: Hematology and Oncology

## 2015-11-16 VITALS — BP 117/75 | HR 60 | Temp 97.5°F | Resp 18 | Ht 64.0 in | Wt 168.3 lb

## 2015-11-16 DIAGNOSIS — R718 Other abnormality of red blood cells: Secondary | ICD-10-CM

## 2015-11-16 DIAGNOSIS — D751 Secondary polycythemia: Secondary | ICD-10-CM | POA: Insufficient documentation

## 2015-11-16 DIAGNOSIS — Z87891 Personal history of nicotine dependence: Secondary | ICD-10-CM | POA: Insufficient documentation

## 2015-11-16 LAB — CBC WITH DIFFERENTIAL/PLATELET
Basophils Absolute: 0 10*3/uL (ref 0–0.1)
Basophils Relative: 1 %
Eosinophils Absolute: 0.1 10*3/uL (ref 0–0.7)
Eosinophils Relative: 2 %
HCT: 45.4 % (ref 40.0–52.0)
Hemoglobin: 16 g/dL (ref 13.0–18.0)
Lymphocytes Relative: 38 %
Lymphs Abs: 1.8 10*3/uL (ref 1.0–3.6)
MCH: 31.5 pg (ref 26.0–34.0)
MCHC: 35.3 g/dL (ref 32.0–36.0)
MCV: 89.3 fL (ref 80.0–100.0)
Monocytes Absolute: 0.4 10*3/uL (ref 0.2–1.0)
Monocytes Relative: 8 %
Neutro Abs: 2.5 10*3/uL (ref 1.4–6.5)
Neutrophils Relative %: 53 %
Platelets: 157 10*3/uL (ref 150–440)
RBC: 5.09 MIL/uL (ref 4.40–5.90)
RDW: 13.3 % (ref 11.5–14.5)
WBC: 4.7 10*3/uL (ref 3.8–10.6)

## 2015-11-16 LAB — COMPREHENSIVE METABOLIC PANEL
ALT: 30 U/L (ref 17–63)
AST: 23 U/L (ref 15–41)
Albumin: 4.5 g/dL (ref 3.5–5.0)
Alkaline Phosphatase: 87 U/L (ref 38–126)
Anion gap: 5 (ref 5–15)
BUN: 11 mg/dL (ref 6–20)
CO2: 27 mmol/L (ref 22–32)
Calcium: 8.9 mg/dL (ref 8.9–10.3)
Chloride: 104 mmol/L (ref 101–111)
Creatinine, Ser: 0.86 mg/dL (ref 0.61–1.24)
GFR calc Af Amer: >60 mL/min (ref >60)
GFR calc non Af Amer: >60 mL/min (ref >60)
Glucose, Bld: 89 mg/dL (ref 65–99)
Potassium: 3.6 mmol/L (ref 3.5–5.1)
Sodium: 136 mmol/L (ref 135–145)
Total Bilirubin: 0.6 mg/dL (ref 0.3–1.2)
Total Protein: 7.1 g/dL (ref 6.5–8.1)

## 2015-11-16 NOTE — Progress Notes (Signed)
Pt reports no changes since last visit.  Reports nausea is gone.  Interpreter at side.  No other concerns noted

## 2015-11-16 NOTE — Progress Notes (Signed)
[place] Clinic day:  11/16/2015   Chief Complaint: Cesar Pace is a 35 y.o. 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  HPI:  The patient was last seen in the medical oncology clinic on 10/16/2015 for review of work-up and discussion regarding direction of therapy.  Work-up revealed a  hematocrit of 49.4, hemoglobin 17.4, MCV 88.8, platelets 203,000, white count 5200 with an ANC of 2700. Differential was unremarkable.  Normal labs included the following: carbon monoxide level (2.3), ferritin (127), iron studies, and SPEP.  Labs returned after last visit.  Erythropoietin level was 6.2 (2.6-18.5).  JAK2 V617F and exon 12 were negative.  Serum immunoglobulins were normal (IgG 1141, IgA 229, IgM 112).  He was felt to have severe dehydration causing relative erythrocytosis.  During the interim, he has done well.  He denies any complaint.    No past medical history on file.  No past surgical history on file.  Family History  Problem Relation Age of Onset  . Hypertension Mother     Social History:  reports that he quit smoking about 10 years ago. His smoking use included Cigarettes. He smoked 0.50 packs per day. He has never used smokeless tobacco. He reports that he does not drink alcohol or use illicit drugs.  The patient is from [place].  He has been in the [place] states for 12 years.  He lived in [place] for 3 years.  He has lived in [place] for 9 years.  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  Allergies: No Known Allergies  Current Medications: Current Outpatient Prescriptions  Medication Sig Dispense Refill  . ondansetron (ZOFRAN) 4 MG tablet Take 1 tablet (4 mg total) by mouth every 8 (eight) hours as needed for nausea or vomiting. (Patient not taking: Reported on 10/16/2015) 20 tablet 0   No current facility-administered medications for this visit.    Review of Systems:   GENERAL:  Feels good.  Active.  No fevers, sweats or weight loss. PERFORMANCE STATUS (ECOG):  0 HEENT:  No visual changes, runny nose, sore throat, mouth sores or tenderness. Lungs: No shortness of breath or cough.  No hemoptysis. Cardiac:  No chest pain, palpitations, orthopnea, or PND. GI:  No nausea, vomiting, diarrhea, constipation, melena or hematochezia. GU:  No urgency, frequency, dysuria, or hematuria. Musculoskeletal:  No back pain.  No joint pain.  No muscle tenderness. Extremities:  No pain or swelling. Skin:  No rashes or skin changes. Neuro:  No headache, numbness or weakness, balance or coordination issues. Endocrine:  No diabetes, thyroid issues, hot flashes or night sweats. Psych:  No mood changes, depression or anxiety. Pain:  No focal pain. Review of systems:  All other systems reviewed and found to be negative.  Physical Exam: Blood pressure 117/75, pulse 60, temperature 97.5 F (36.4 C), temperature source Tympanic, resp. rate 18, height 5' 4" (1.626 m), weight 168 lb 5.1 oz (76.35 kg). GENERAL:  Well developed, well nourished, gentleman sitting comfortably in the exam room in no acute distress. MENTAL STATUS:  Alert and oriented to person, place and time. HEAD:  Short black hair.  Mustache.  Normocephalic, atraumatic, face symmetric, no Cushingoid features. EYES:  Brown eyes.  No conjunctivitis or scleral icterus. NEUROLOGICAL: Unremarkable. PSYCH:  Appropriate.  Appointment on 11/16/2015  Component Date Value Ref Range Status  . WBC 11/16/2015 4.7  3.8 - 10.6 K/uL Final  . RBC 11/16/2015 5.09  4.40 - 5.90 MIL/uL Final  . Hemoglobin 11/16/2015 16.0  13.0 - 18.0 g/dL Final  . HCT 11/16/2015 45.4  40.0 - 52.0 % Final  . MCV 11/16/2015 89.3  80.0 - 100.0 fL Final  . MCH 11/16/2015 31.5  26.0 - 34.0 pg Final  . MCHC 11/16/2015 35.3  32.0 - 36.0 g/dL Final  . RDW 11/16/2015 13.3  11.5 - 14.5 % Final  . Platelets 11/16/2015 157  150 - 440 K/uL Final  .  Neutrophils Relative % 11/16/2015 53   Final  . Neutro Abs 11/16/2015 2.5  1.4 - 6.5 K/uL Final  . Lymphocytes Relative 11/16/2015 38   Final  . Lymphs Abs 11/16/2015 1.8  1.0 - 3.6 K/uL Final  . Monocytes Relative 11/16/2015 8   Final  . Monocytes Absolute 11/16/2015 0.4  0.2 - 1.0 K/uL Final  . Eosinophils Relative 11/16/2015 2   Final  . Eosinophils Absolute 11/16/2015 0.1  0 - 0.7 K/uL Final  . Basophils Relative 11/16/2015 1   Final  . Basophils Absolute 11/16/2015 0.0  0 - 0.1 K/uL Final  . Sodium 11/16/2015 136  135 - 145 mmol/L Final  . Potassium 11/16/2015 3.6  3.5 - 5.1 mmol/L Final  . Chloride 11/16/2015 104  101 - 111 mmol/L Final  . CO2 11/16/2015 27  22 - 32 mmol/L Final  . Glucose, Bld 11/16/2015 89  65 - 99 mg/dL Final  . BUN 11/16/2015 11  6 - 20 mg/dL Final  . Creatinine, Ser 11/16/2015 0.86  0.61 - 1.24 mg/dL Final  . Calcium 11/16/2015 8.9  8.9 - 10.3 mg/dL Final  . Total Protein 11/16/2015 7.1  6.5 - 8.1 g/dL Final  . Albumin 11/16/2015 4.5  3.5 - 5.0 g/dL Final  . AST 11/16/2015 23  15 - 41 U/L Final  . ALT 11/16/2015 30  17 - 63 U/L Final  . Alkaline Phosphatase 11/16/2015 87  38 - 126 U/L Final  . Total Bilirubin 11/16/2015 0.6  0.3 - 1.2 mg/dL Final  . GFR calc non Af Amer 11/16/2015 >60  >60 mL/min Final  . GFR calc Af Amer 11/16/2015 >60  >60 mL/min Final   Comment: (NOTE) The eGFR has been calculated using the CKD EPI equation. This calculation has not been validated in all clinical situations. eGFR's persistently <60 mL/min signify possible Chronic Kidney Disease.   . Anion gap 11/16/2015 5  5 - 15 Final    Assessment:  Cesar Pace is a 35 y.o. male with relative polycythemia.  He presented with erythrocytosis on 10/01/2015 after working outside and experiencing several episodes of nausea and vomiting.  Labs suggested possible dehydration.  He has no prior labs.  He denies any tobacco use.  He denies any cardiac or pulmonary problems.  He  denies sleep apnea.  He does not take testosterone.  He denies any family history of any blood disorder.  Labs on 10/01/2015 revealed a hematocrit of 56.7, hemoglobin 19.3, MCV 90.4, platelets 201,000, and WBC 11,600.  BUN was 20 with a creatinine of 1.14.  Calcium was 10.3, albumen 6.0, and protein 9.4.    Work-up on 10/05/2015 revealed a  hematocrit of 49.4, hemoglobin 17.4, MCV 88.8, platelets 203,000, white count 5200 with an ANC of 2700. Differential was unremarkable.  Normal labs included the following: erythropoietin level (6.2), JAK2 V617F and exon 12, carbon monoxide level (2.3), ferritin (127), iron studies, SPEP, and immunoglobulins.  Symptomatically, he feels well.  Exam is unremarkable.  CBC   is normal.  Plan: 1.  Labs today:  CBC with diff, CMP. 2.  Review labs from original work-up.  All labs were normal.  Patient had relative polycthemia secondary to severe dehydration. 3.  RTC prn.    Lequita Asal, MD  11/16/2015, 11:04 AM

## 2016-09-02 ENCOUNTER — Encounter: Payer: Self-pay | Admitting: Emergency Medicine

## 2016-09-02 ENCOUNTER — Emergency Department
Admission: EM | Admit: 2016-09-02 | Discharge: 2016-09-02 | Disposition: A | Discharge status: Home / Self Care | Payer: Self-pay | Attending: Emergency Medicine | Admitting: Emergency Medicine

## 2016-09-02 DIAGNOSIS — S61211A Laceration without foreign body of left index finger without damage to nail, initial encounter: Secondary | ICD-10-CM | POA: Insufficient documentation

## 2016-09-02 DIAGNOSIS — Z87891 Personal history of nicotine dependence: Secondary | ICD-10-CM | POA: Insufficient documentation

## 2016-09-02 DIAGNOSIS — W293XXA Contact with powered garden and outdoor hand tools and machinery, initial encounter: Secondary | ICD-10-CM | POA: Insufficient documentation

## 2016-09-02 DIAGNOSIS — Y9389 Activity, other specified: Secondary | ICD-10-CM | POA: Insufficient documentation

## 2016-09-02 DIAGNOSIS — Z23 Encounter for immunization: Secondary | ICD-10-CM | POA: Insufficient documentation

## 2016-09-02 DIAGNOSIS — Y990 Civilian activity done for income or pay: Secondary | ICD-10-CM | POA: Insufficient documentation

## 2016-09-02 DIAGNOSIS — Y929 Unspecified place or not applicable: Secondary | ICD-10-CM | POA: Insufficient documentation

## 2016-09-02 MED ORDER — LIDOCAINE HCL (PF) 1 % IJ SOLN
INTRAMUSCULAR | Status: AC
  Administered 2016-09-02: 5 mL
  Filled 2016-09-02: qty 5

## 2016-09-02 MED ORDER — CEPHALEXIN 500 MG PO CAPS: 500.0000 mg | ORAL_CAPSULE | Freq: Four times a day (QID) | ORAL | 0 refills | Status: AC

## 2016-09-02 MED ORDER — HYDROCODONE-ACETAMINOPHEN 5-325 MG PO TABS: 1.0000 | ORAL_TABLET | Freq: Four times a day (QID) | ORAL | 0 refills | Status: AC | PRN

## 2016-09-02 MED ORDER — TETANUS-DIPHTH-ACELL PERTUSSIS 5-2.5-18.5 LF-MCG/0.5 IM SUSP
0.5000 mL | Freq: Once | INTRAMUSCULAR | Status: AC
  Administered 2016-09-02: 0.5 mL via INTRAMUSCULAR
  Filled 2016-09-02: qty 0.5

## 2016-09-02 NOTE — ED Provider Notes (Signed)
[facility] Emergency Department Provider Note  ____________________________________________  Time seen: Approximately 6:13 PM  I have reviewed the triage vital signs and the nursing notes.   HISTORY  Chief Complaint Laceration    HPI Cesar Pace is a 36 y.o. male that presents to emergency department with finger lacerations after cutting finger with a chainsaw. Patient denies any additional injuries. He is not on blood thinner. Last tetanus shot was in 2011. He is not allergic to any antibiotics. He denies shortness of breath, chest pain, nausea, vomiting, abdominal pain.   History reviewed. No pertinent past medical history.  Patient Active Problem List   Diagnosis Date Noted  . Elevated hematocrit 10/05/2015  . Elevated hematocrit 10/05/2015  . Relative polycythemia 10/01/2015    History reviewed. No pertinent surgical history.  Prior to Admission medications   Medication Sig Start Date End Date Taking? Authorizing Provider  cephALEXin (KEFLEX) 500 MG capsule Take 1 capsule (500 mg total) by mouth 4 (four) times daily. 09/02/16 09/12/16  Enid Derry, PA-C  HYDROcodone-acetaminophen (NORCO/VICODIN) 5-325 MG tablet Take 1 tablet by mouth every 6 (six) hours as needed for moderate pain. 09/02/16   Enid Derry, PA-C    Allergies Patient has no known allergies.  Family History  Problem Relation Age of Onset  . Hypertension Mother     Social History Social History  Substance Use Topics  . Smoking status: Former Smoker    Packs/day: 0.50    Types: Cigarettes    Quit date: 04/06/2005  . Smokeless tobacco: Never Used  . Alcohol use No     Review of Systems  Constitutional: No fever/chills Cardiovascular: No chest pain. Respiratory:  No SOB. Gastrointestinal: No abdominal pain.  No nausea, no vomiting.  Musculoskeletal: Positive for left hand pain. Skin: Negative for rash, ecchymosis. Positive for laceration. Neurological:  Negative for headaches, numbness or tingling   ____________________________________________   PHYSICAL EXAM:  VITAL SIGNS: ED Triage Vitals  Enc Vitals Group     BP 09/02/16 1615 121/75     Pulse Rate 09/02/16 1615 65     Resp 09/02/16 1615 16     Temp 09/02/16 1615 98.3 F (36.8 C)     Temp Source 09/02/16 1615 Oral     SpO2 09/02/16 1615 98 %     Weight 09/02/16 1614 168 lb (76.2 kg)     Height --      Head Circumference --      Peak Flow --      Pain Score 09/02/16 1613 7     Pain Loc --      Pain Edu? --      Excl. in GC? --      Constitutional: Alert and oriented. Well appearing and in no acute distress. Eyes: Conjunctivae are normal. PERRL. EOMI. Head: Atraumatic. ENT:      Ears:      Nose: No congestion/rhinnorhea.      Mouth/Throat: Mucous membranes are moist.  Neck: No stridor. Cardiovascular: Normal rate, regular rhythm.  Good peripheral circulation. Respiratory: Normal respiratory effort without tachypnea or retractions. Lungs CTAB. Good air entry to the bases with no decreased or absent breath sounds. Musculoskeletal: Full range of motion to all extremities. No gross deformities appreciated. Strength 5/5 in fingers. Neurologic:  Normal speech and language. No gross focal neurologic deficits are appreciated.  Skin:  Skin is warm, dry. No rash noted. 1.5 cm circular shave jagged laceration to distal index finger. Mild swelling. Bleeding controlled. 1/4  cm abrasion to palmar side of left ring finger. 1/4 cm laceration to dorsal side of left little finger.   ____________________________________________   LABS (all labs ordered are listed, but only abnormal results are displayed)  Labs Reviewed - No data to display ____________________________________________  EKG   ____________________________________________  RADIOLOGY  No results found.  ____________________________________________    PROCEDURES  Procedure(s) performed:     Procedures  LACERATION REPAIR Performed by: Enid Derry  Consent: Verbal consent obtained.  Consent given by: patient  Prepped and Draped in normal sterile fashion  Wound explored: No foreign bodies   Laceration Location: index finger  Laceration Length: 1.5 cm  Anesthesia: None  Local anesthetic: lidocaine 1% without epinephrine  Anesthetic total: 5 ml  Irrigation method: syringe  Amount of cleaning: normal saline  Skin closure: 4-0 nylon  Number of sutures: 10  Technique: Simple interrupted  Patient tolerance: Patient tolerated the procedure well with no immediate complications.  Medications  lidocaine (PF) (XYLOCAINE) 1 % injection (5 mLs  Given 09/02/16 1822)  Tdap (BOOSTRIX) injection 0.5 mL (0.5 mLs Intramuscular Given 09/02/16 1820)   Dermabond was applied to left little finger laceration.   ____________________________________________   INITIAL IMPRESSION / ASSESSMENT AND PLAN / ED COURSE  Pertinent labs & imaging results that were available during my care of the patient were reviewed by me and considered in my medical decision making (see chart for details).  Review of the [place] CSRS was performed in accordance of the [facility] prior to dispensing any controlled drugs.   Patient's diagnosis is consistent with finger lacerations. Vital signs and exam are reassuring. Index finger laceration was repaired with sutures. Little finger laceration was repaired with Dermabond. No indication for sutures, steri-strips, or dermabond for ring finger abrasion. Bandages were placed on all fingers. Splint was applied to index finger. Tetanus shot was updated. Patient will be discharged home with prescriptions for Keflex. Patient is to follow up with hand specialist as directed. Patient is given ED precautions to return to the ED for any worsening or new symptoms.     ____________________________________________  FINAL CLINICAL IMPRESSION(S) / ED  DIAGNOSES  Final diagnoses:  Laceration of left index finger without foreign body without damage to nail, initial encounter      NEW MEDICATIONS STARTED DURING THIS VISIT:  Discharge Medication List as of 09/02/2016  6:30 PM    START taking these medications   Details  cephALEXin (KEFLEX) 500 MG capsule Take 1 capsule (500 mg total) by mouth 4 (four) times daily., Starting Fri 09/02/2016, Until Mon 09/12/2016, Print    HYDROcodone-acetaminophen (NORCO/VICODIN) 5-325 MG tablet Take 1 tablet by mouth every 6 (six) hours as needed for moderate pain., Starting Fri 09/02/2016, Print            This chart was dictated using voice recognition software/Dragon. Despite best efforts to proofread, errors can occur which can change the meaning. Any change was purely unintentional.    Enid Derry, PA-C 09/02/16 2248    Myrna Blazer, MD 09/02/16 [phone]

## 2016-09-02 NOTE — ED Notes (Signed)
See triage note  States he was using a saw  Slipped and cut hit left index finger

## 2016-09-02 NOTE — ED Triage Notes (Signed)
Pt to ED via POV, pt states that while at work today he was cutting a piece of wood and accidentally cut his finger. Bleeding controlled at this time.
